# Patient Record
Sex: Female | Born: 2000 | Race: White | Hispanic: No | Marital: Single | Smoking: Never smoker
Health system: Southern US, Community
[De-identification: ages and names within clinical notes are randomized; demographics above are authoritative.]

## PROBLEM LIST (undated history)

## (undated) DIAGNOSIS — R1115 Cyclical vomiting syndrome unrelated to migraine: Secondary | ICD-10-CM

## (undated) DIAGNOSIS — K219 Gastro-esophageal reflux disease without esophagitis: Secondary | ICD-10-CM

## (undated) HISTORY — DX: Gastro-esophageal reflux disease without esophagitis: K21.9

## (undated) HISTORY — DX: Cyclical vomiting syndrome unrelated to migraine: R11.15

---

## 2019-04-09 ENCOUNTER — Other Ambulatory Visit: Payer: Self-pay

## 2019-04-09 ENCOUNTER — Ambulatory Visit (HOSPITAL_COMMUNITY)
Admission: EM | Admit: 2019-04-09 | Discharge: 2019-04-09 | Disposition: A | Discharge status: Home / Self Care | Payer: BC Managed Care – PPO | Attending: Internal Medicine | Admitting: Internal Medicine

## 2019-04-09 ENCOUNTER — Ambulatory Visit (INDEPENDENT_AMBULATORY_CARE_PROVIDER_SITE_OTHER): Payer: BC Managed Care – PPO

## 2019-04-09 ENCOUNTER — Encounter (HOSPITAL_COMMUNITY): Payer: Self-pay

## 2019-04-09 DIAGNOSIS — N23 Unspecified renal colic: Secondary | ICD-10-CM

## 2019-04-09 DIAGNOSIS — R109 Unspecified abdominal pain: Secondary | ICD-10-CM | POA: Diagnosis not present

## 2019-04-09 DIAGNOSIS — Z3202 Encounter for pregnancy test, result negative: Secondary | ICD-10-CM | POA: Diagnosis not present

## 2019-04-09 LAB — POCT URINALYSIS DIP (DEVICE)
Bilirubin Urine: NEGATIVE
Glucose, UA: NEGATIVE mg/dL
Ketones, ur: >=160 mg/dL — AB
Leukocytes,Ua: NEGATIVE
Nitrite: NEGATIVE
Protein, ur: 100 mg/dL — AB
Specific Gravity, Urine: 1.025 (ref 1.005–1.030)
Urobilinogen, UA: 0.2 mg/dL (ref 0.0–1.0)
pH: 6 (ref 5.0–8.0)

## 2019-04-09 LAB — POCT PREGNANCY, URINE: Preg Test, Ur: NEGATIVE

## 2019-04-09 MED ORDER — TAMSULOSIN HCL 0.4 MG PO CAPS: 0.4000 mg | ORAL_CAPSULE | Freq: Every day | ORAL | 0 refills | Status: DC

## 2019-04-09 MED ORDER — ONDANSETRON 4 MG PO TBDP: 4.0000 mg | ORAL_TABLET | Freq: Three times a day (TID) | ORAL | 0 refills | Status: DC | PRN

## 2019-04-09 NOTE — ED Provider Notes (Signed)
[facility]    CSN: [id number] Arrival date & time: 04/09/19  1458      History   Chief Complaint Chief Complaint  Patient presents with  . Flank Pain  . Emesis    HPI Dominique Pruitt is a 18 y.o. female with no past medical history comes to the urgent care with complaints of 1 day history of right flank pain.  Pain started suddenly, initially crampy then became severe.  No known relief at the height of the pain.  Patient had nausea with recurrent vomiting overnight.  No fever or chills.  She denies any dysuria, urgency or frequency.  No blood in urine noted.  Pain improved this morning.  Currently has mild abdominal pain in the right flank quadrant. HPI  History reviewed. No pertinent past medical history.  There are no active problems to display for this patient.   History reviewed. No pertinent surgical history.  OB History   No obstetric history on file.      Home Medications    Prior to Admission medications   Medication Sig Start Date End Date Taking? Authorizing Provider  buPROPion (WELLBUTRIN XL) 300 MG 24 hr tablet TAKE 1 TABLET BY MOUTH EVERYDAY AT BEDTIME 11/05/18   [provider]  NORTREL 0.5/35, 28, 0.5-35 MG-MCG tablet Take 1 tablet by mouth daily. 02/14/19   [provider]  ondansetron (ZOFRAN ODT) 4 MG disintegrating tablet Take 1 tablet (4 mg total) by mouth every 8 (eight) hours as needed for nausea or vomiting. 04/09/19   Anh Bigos, Britta Mccreedy, MD    Family History Family History  Problem Relation Age of Onset  . Healthy Mother   . Healthy Father     Social History Social History   Tobacco Use  . Smoking status: Never Smoker  . Smokeless tobacco: Never Used  Substance Use Topics  . Alcohol use: Never    Frequency: Never  . Drug use: Yes    Types: Marijuana     Allergies   Amoxicillin and Sulfa antibiotics   Review of Systems Review of Systems  Constitutional: Negative.   Respiratory: Negative.    Gastrointestinal: Positive for nausea and vomiting. Negative for abdominal distention, abdominal pain, blood in stool, constipation and diarrhea.  Genitourinary: Positive for flank pain. Negative for dyspareunia, dysuria, frequency, pelvic pain, vaginal bleeding, vaginal discharge and vaginal pain.  Musculoskeletal: Negative for arthralgias and joint swelling.  Skin: Negative.   Neurological: Negative for dizziness, weakness and light-headedness.     Physical Exam Triage Vital Signs ED Triage Vitals  Enc Vitals Group     BP 04/09/19 1525 125/73     Pulse Rate 04/09/19 1525 96     Resp 04/09/19 1525 16     Temp 04/09/19 1525 98.7 F (37.1 C)     Temp Source 04/09/19 1525 Oral     SpO2 04/09/19 1525 95 %     Weight --      Height --      Head Circumference --      Peak Flow --      Pain Score 04/09/19 1521 9     Pain Loc --      Pain Edu? --      Excl. in GC? --    No data found.  Updated Vital Signs BP 125/73   Pulse 96   Temp 98.7 F (37.1 C) (Oral)   Resp 16   LMP 03/19/2019 (Approximate) Comment: negative pregnancy test 04/09/2019  SpO2 95%  Visual Acuity Right Eye Distance:   Left Eye Distance:   Bilateral Distance:    Right Eye Near:   Left Eye Near:    Bilateral Near:     Physical Exam   UC Treatments / Results  Labs (all labs ordered are listed, but only abnormal results are displayed) Labs Reviewed  POCT URINALYSIS DIP (DEVICE) - Abnormal; Notable for the following components:      Result Value   Ketones, ur >=160 (*)    Hgb urine dipstick LARGE (*)    Protein, ur 100 (*)    All other components within normal limits  POCT PREGNANCY, URINE    EKG   Radiology Dg Abdomen 1 View  Result Date: 04/09/2019 CLINICAL DATA:  Severe right lower abdominal pain EXAM: ABDOMEN - 1 VIEW COMPARISON:  None. FINDINGS: Nonspecific nonobstructed bowel gas pattern with overall paucity of bowel gas. No abnormal calcification. IMPRESSION: Nonobstructed gas  pattern with paucity of abdominal bowel gas Electronically Signed   By: Donavan Foil M.D.   On: 04/09/2019 16:23    Procedures Procedures (including critical care time)  Medications Ordered in UC Medications - No data to display  Initial Impression / Assessment and Plan / UC Course  I have reviewed the triage vital signs and the nursing notes.  Pertinent labs & imaging results that were available during my care of the patient were reviewed by me and considered in my medical decision making (see chart for details).     1.  Right flank pain likely renal colic, currently improved: KUB is negative for renal stone Patient is advised to return to urgent care or ED if she develops worsening abdominal pain, fever, chills or dysuria.   Zofran as needed for nausea and vomiting Ibuprofen 400 mg every 6 hours as needed for pain I suspect this abdominal pain is secondary to renal stone which is likely passed. Final Clinical Impressions(s) / UC Diagnoses   Final diagnoses:  Renal colic on right side   Discharge Instructions   None    ED Prescriptions    Medication Sig Dispense Auth. Provider   tamsulosin (FLOMAX) 0.4 MG CAPS capsule  (Status: Discontinued) Take 1 capsule (0.4 mg total) by mouth daily after supper for 3 days. 3 capsule Sahithi Ordoyne, Myrene Galas, MD   ondansetron (ZOFRAN ODT) 4 MG disintegrating tablet Take 1 tablet (4 mg total) by mouth every 8 (eight) hours as needed for nausea or vomiting. 20 tablet Dehaven Sine, Myrene Galas, MD     PDMP not reviewed this encounter.   Chase Picket, MD 04/09/19 [phone]

## 2019-04-09 NOTE — ED Triage Notes (Signed)
Patient report having right flank pain and vomiting episodes for 1 day.

## 2020-04-29 IMAGING — DX DG ABDOMEN 1V
1 series · 1 of 1 positions shown · non-contrast
Comparison: None.

CLINICAL DATA: Severe right lower abdominal pain

EXAM:
ABDOMEN - 1 VIEW

[abdomen kub]
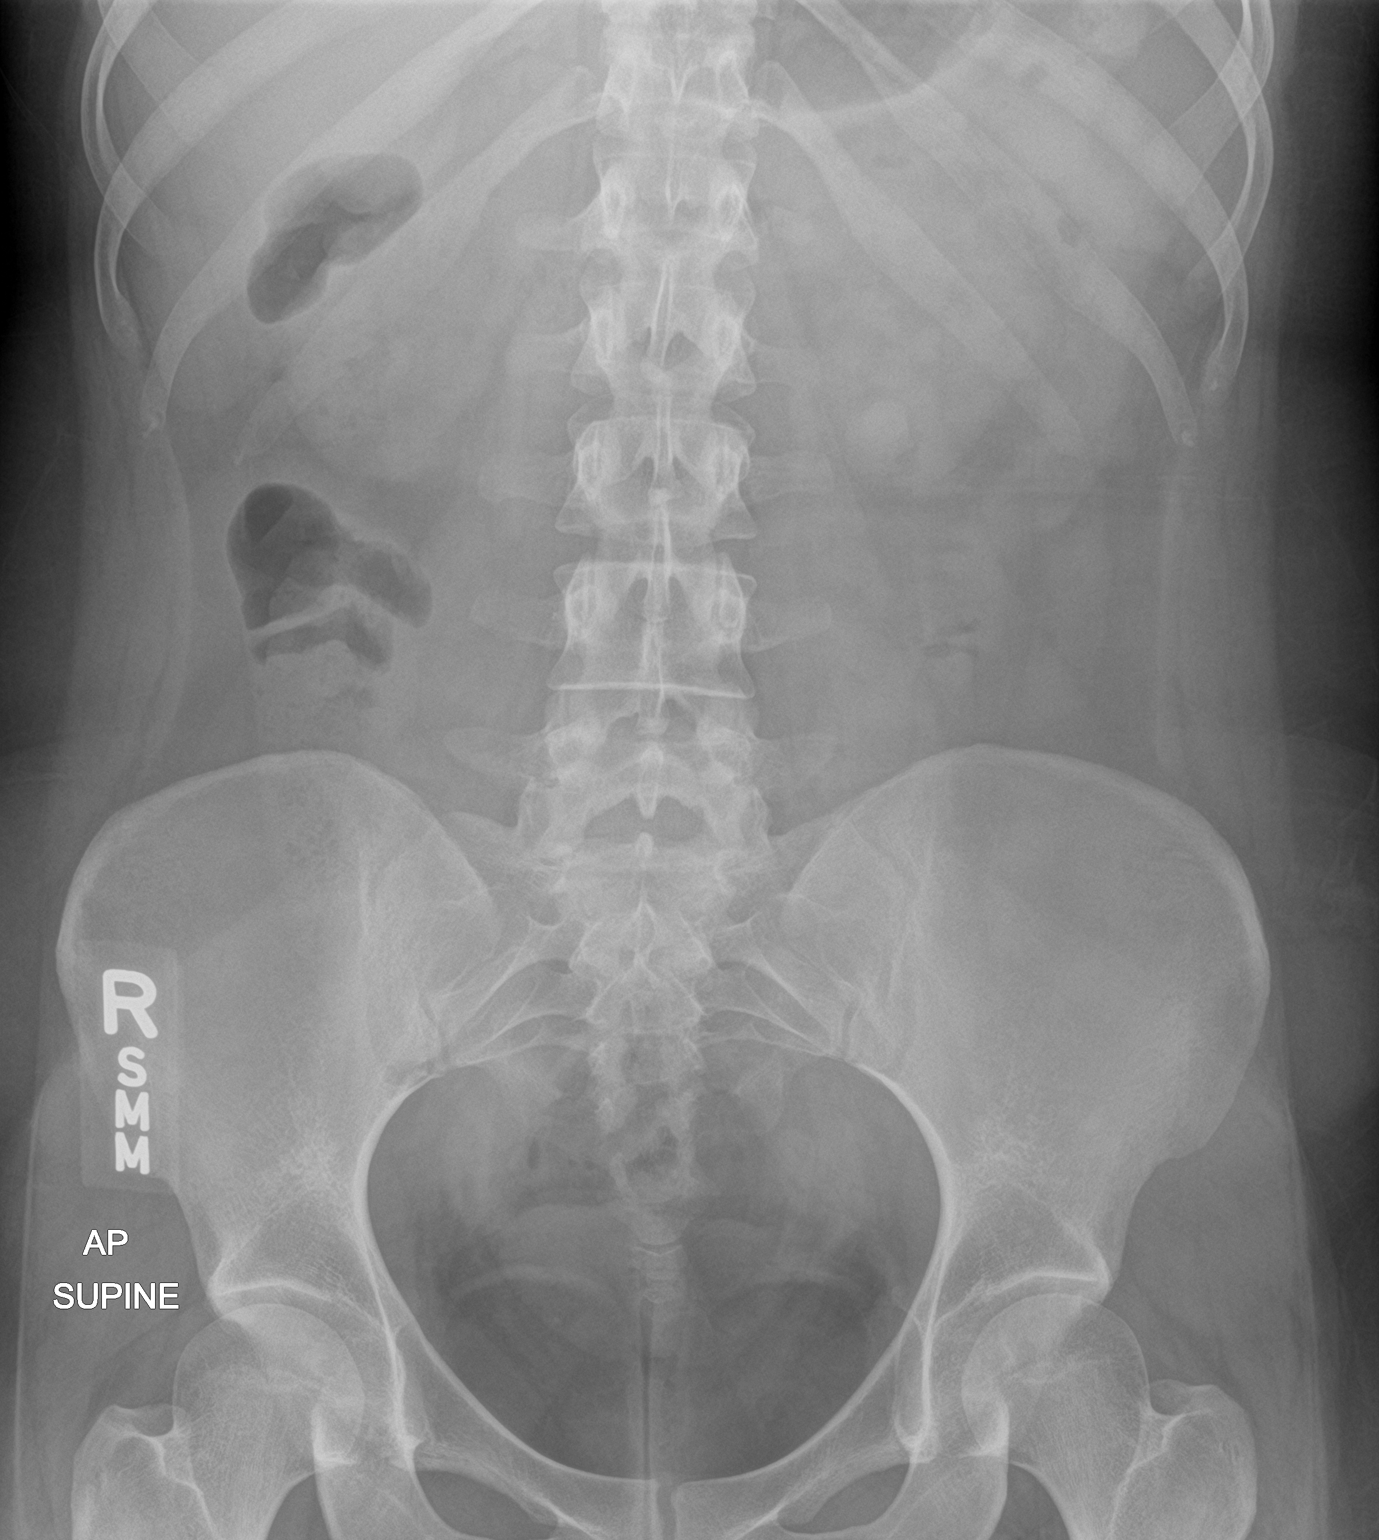

[1 of 1 positions shown; findings below may reference images not displayed]

FINDINGS: Nonspecific nonobstructed bowel gas pattern with overall paucity of
bowel gas. No abnormal calcification.
IMPRESSION: Nonobstructed gas pattern with paucity of abdominal bowel gas

## 2024-07-27 ENCOUNTER — Inpatient Hospital Stay

## 2024-07-27 ENCOUNTER — Inpatient Hospital Stay: Admitting: Hematology and Oncology

## 2024-08-03 ENCOUNTER — Inpatient Hospital Stay

## 2024-08-03 ENCOUNTER — Inpatient Hospital Stay: Admitting: Hematology and Oncology

## 2024-08-11 ENCOUNTER — Inpatient Hospital Stay: Payer: Self-pay | Admitting: Hematology and Oncology

## 2024-08-11 ENCOUNTER — Inpatient Hospital Stay: Payer: Self-pay | Attending: Hematology and Oncology

## 2024-08-11 ENCOUNTER — Other Ambulatory Visit: Payer: Self-pay

## 2024-08-11 ENCOUNTER — Encounter: Payer: Self-pay | Admitting: Hematology and Oncology

## 2024-08-11 ENCOUNTER — Other Ambulatory Visit: Payer: Self-pay | Admitting: Hematology and Oncology

## 2024-08-11 DIAGNOSIS — D72829 Elevated white blood cell count, unspecified: Secondary | ICD-10-CM

## 2024-08-11 LAB — CBC WITH DIFFERENTIAL (CANCER CENTER ONLY)
Abs Immature Granulocytes: 0.04 10*3/uL (ref 0.00–0.07)
Basophils Absolute: 0 10*3/uL (ref 0.0–0.1)
Basophils Relative: 0 %
Eosinophils Absolute: 0.1 10*3/uL (ref 0.0–0.5)
Eosinophils Relative: 1 %
HCT: 38 % (ref 36.0–46.0)
Hemoglobin: 12.7 g/dL (ref 12.0–15.0)
Immature Granulocytes: 0 %
Lymphocytes Relative: 22 %
Lymphs Abs: 2.3 10*3/uL (ref 0.7–4.0)
MCH: 27.4 pg (ref 26.0–34.0)
MCHC: 33.4 g/dL (ref 30.0–36.0)
MCV: 82.1 fL (ref 80.0–100.0)
Monocytes Absolute: 0.6 10*3/uL (ref 0.1–1.0)
Monocytes Relative: 6 %
Neutro Abs: 7.6 10*3/uL (ref 1.7–7.7)
Neutrophils Relative %: 71 %
Platelet Count: 312 10*3/uL (ref 150–400)
RBC: 4.63 MIL/uL (ref 3.87–5.11)
RDW: 13.7 % (ref 11.5–15.5)
WBC Count: 10.6 10*3/uL — ABNORMAL HIGH (ref 4.0–10.5)
nRBC: 0 % (ref 0.0–0.2)

## 2024-08-11 LAB — CMP (CANCER CENTER ONLY)
ALT: 22 U/L (ref 0–44)
AST: 23 U/L (ref 15–41)
Albumin: 4.7 g/dL (ref 3.5–5.0)
Alkaline Phosphatase: 85 U/L (ref 38–126)
Anion gap: 12 (ref 5–15)
BUN: 7 mg/dL (ref 6–20)
CO2: 24 mmol/L (ref 22–32)
Calcium: 10 mg/dL (ref 8.9–10.3)
Chloride: 103 mmol/L (ref 98–111)
Creatinine: 0.71 mg/dL (ref 0.44–1.00)
GFR, Estimated: >60 mL/min
Glucose, Bld: 92 mg/dL (ref 70–99)
Potassium: 3.9 mmol/L (ref 3.5–5.1)
Sodium: 139 mmol/L (ref 135–145)
Total Bilirubin: 0.3 mg/dL (ref 0.0–1.2)
Total Protein: 7.4 g/dL (ref 6.5–8.1)

## 2024-08-11 LAB — VITAMIN B12: Vitamin B-12: 545 pg/mL (ref 180–914)

## 2024-08-11 LAB — IRON AND TIBC
Iron: 42 ug/dL (ref 28–170)
Saturation Ratios: 12 % (ref 10.4–31.8)
TIBC: 363 ug/dL (ref 250–450)
UIBC: 321 ug/dL

## 2024-08-11 LAB — FERRITIN: Ferritin: 59 ng/mL (ref 11–307)

## 2024-08-11 LAB — FOLATE: Folate: 11.2 ng/mL

## 2024-08-11 LAB — TSH: TSH: 1.35 u[IU]/mL (ref 0.350–4.500)

## 2024-08-11 NOTE — Progress Notes (Unsigned)
 " [facility] [address] [place],  [place]  [address] [phone]  Clinic Day:  08/11/2024   Referring physician: Tetter, Devin B, NP  Patient Care Team: Patient Care Team: Nestor Elston NOVAK, NP as PCP - General (Nurse Practitioner)   REASON FOR CONSULTATION:    HISTORY OF PRESENT ILLNESS:   Dominique Pruitt is a 24 y.o. female with a history of *** who is referred in consultation by {REFERRING PHYSICIAN} for assessment and management. ***   REVIEW OF SYSTEMS:   Review of Systems - Oncology   VITALS:   There were no vitals taken for this visit.  Wt Readings from Last 3 Encounters:  No data found for Wt    There is no height or weight on file to calculate BMI.  Performance status (ECOG): {CHL ONC [id number]  PHYSICAL EXAM:   Physical Exam   LABS:       No data to display             No data to display           No results found for: CEA1, CEA / No results found for: CEA1, CEA No results found for: PSA1 No results found for: CAN199 No results found for: CAN125  No results found for: TOTALPROTELP, ALBUMINELP, A1GS, A2GS, BETS, BETA2SER, GAMS, MSPIKE, SPEI No results found for: TIBC, FERRITIN, IRONPCTSAT No results found for: LDH  STUDIES:   No results found.    HISTORY:   Past Medical History:  Diagnosis Date   Cyclic vomiting syndrome    GERD (gastroesophageal reflux disease)     Past Surgical History:  Procedure Laterality Date   CHOLECYSTECTOMY     SPINE SURGERY      Family History  Problem Relation Age of Onset   Healthy Mother    Alcohol abuse Father    Stroke Father    Diabetes Sister    Diabetes Sister    Sleep apnea Paternal Uncle    Hypertension Paternal Grandmother    Arthritis Paternal Grandmother    Hearing loss Paternal Grandfather     Social History:  reports that she has never smoked. She has never used smokeless tobacco. She reports current drug  use. Drug: Marijuana. She reports that she does not drink alcohol.The patient is {Blank single:19197::alone,accompanied by} *** today.  Allergies: Allergies[1]  Current Medications: Current Outpatient Medications  Medication Sig Dispense Refill   buPROPion (WELLBUTRIN XL) 300 MG 24 hr tablet TAKE 1 TABLET BY MOUTH EVERYDAY AT BEDTIME     busPIRone (BUSPAR) 5 MG tablet Take 5 mg by mouth 2 (two) times daily.     norgestimate-ethinyl estradiol (ORTHO-CYCLEN) 0.25-35 MG-MCG tablet Take 1 tablet by mouth daily.     pantoprazole (PROTONIX) 40 MG tablet Take 40 mg by mouth 2 (two) times daily.     No current facility-administered medications for this visit.     ASSESSMENT & PLAN:   Assessment:  Dominique Pruitt is a 24 y.o. female ***  Plan: 1.  ***  I discussed the assessment and treatment plan with the patient.  The patient was provided an opportunity to ask questions and all were answered.  The patient agreed with the plan and demonstrated an understanding of the instructions.    Thank you for the referral    *** minutes was spent in patient care.  This included time spent preparing to see the patient (e.g., review of tests), obtaining and/or reviewing separately obtained history, counseling and educating  the patient/family/caregiver, ordering medications, tests, or procedures; documenting clinical information in the electronic or other health record, independently interpreting results and communicating results to the patient/family/caregiver as well as coordination of care.      Eleanor DELENA Bach, NP   Physician Assistant [facility] [place] [phone]        [1]  Allergies Allergen Reactions   Amoxicillin    Sulfa Antibiotics    "

## 2024-08-12 ENCOUNTER — Encounter: Payer: Self-pay | Admitting: Hematology and Oncology
